# Patient Record
Sex: Male | Born: 1960 | Race: Black or African American | Hispanic: No | Marital: Single | Smoking: Never smoker
Health system: Southern US, Community
[De-identification: ages and names within clinical notes are randomized; demographics above are authoritative.]

## PROBLEM LIST (undated history)

## (undated) DIAGNOSIS — I10 Essential (primary) hypertension: Secondary | ICD-10-CM

## (undated) DIAGNOSIS — E119 Type 2 diabetes mellitus without complications: Secondary | ICD-10-CM

## (undated) HISTORY — PX: KNEE ARTHROSCOPY: SUR90

---

## 2014-12-16 ENCOUNTER — Emergency Department: Payer: Medicare PPO

## 2014-12-16 ENCOUNTER — Emergency Department
Admission: EM | Admit: 2014-12-16 | Discharge: 2014-12-16 | Disposition: A | Discharge status: Home / Self Care | Payer: Medicare PPO | Attending: Emergency Medicine | Admitting: Emergency Medicine

## 2014-12-16 DIAGNOSIS — Y92481 Parking lot as the place of occurrence of the external cause: Secondary | ICD-10-CM | POA: Diagnosis not present

## 2014-12-16 DIAGNOSIS — E119 Type 2 diabetes mellitus without complications: Secondary | ICD-10-CM | POA: Diagnosis not present

## 2014-12-16 DIAGNOSIS — Y998 Other external cause status: Secondary | ICD-10-CM | POA: Diagnosis not present

## 2014-12-16 DIAGNOSIS — Y9301 Activity, walking, marching and hiking: Secondary | ICD-10-CM | POA: Insufficient documentation

## 2014-12-16 DIAGNOSIS — I10 Essential (primary) hypertension: Secondary | ICD-10-CM | POA: Insufficient documentation

## 2014-12-16 DIAGNOSIS — M25531 Pain in right wrist: Secondary | ICD-10-CM

## 2014-12-16 DIAGNOSIS — S6991XA Unspecified injury of right wrist, hand and finger(s), initial encounter: Secondary | ICD-10-CM | POA: Insufficient documentation

## 2014-12-16 HISTORY — DX: Essential (primary) hypertension: I10

## 2014-12-16 HISTORY — DX: Type 2 diabetes mellitus without complications: E11.9

## 2014-12-16 MED ORDER — NAPROXEN 500 MG PO TABS
500.0000 mg | ORAL_TABLET | Freq: Once | ORAL | Status: AC
  Administered 2014-12-16: 500 mg via ORAL
  Filled 2014-12-16: qty 1

## 2014-12-16 MED ORDER — NAPROXEN SODIUM 275 MG PO TABS
275.0000 mg | ORAL_TABLET | Freq: Two times a day (BID) | ORAL | Status: AC
Start: 2014-12-16 — End: 2015-12-16
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
